# Patient Record
Sex: Male | Born: 1965 | Hispanic: Yes | Marital: Single | Smoking: Never smoker
Health system: Southern US, Community
[De-identification: ages and names within clinical notes are randomized; demographics above are authoritative.]

## PROBLEM LIST (undated history)

## (undated) HISTORY — PX: CHOLECYSTECTOMY: SHX55

---

## 2021-06-21 ENCOUNTER — Emergency Department: Payer: Self-pay

## 2021-06-21 ENCOUNTER — Emergency Department
Admission: EM | Admit: 2021-06-21 | Discharge: 2021-06-21 | Disposition: A | Discharge status: Home / Self Care | Payer: Self-pay | Attending: Emergency Medicine | Admitting: Emergency Medicine

## 2021-06-21 DIAGNOSIS — R1011 Right upper quadrant pain: Secondary | ICD-10-CM | POA: Insufficient documentation

## 2021-06-21 DIAGNOSIS — R1012 Left upper quadrant pain: Secondary | ICD-10-CM | POA: Insufficient documentation

## 2021-06-21 DIAGNOSIS — R1010 Upper abdominal pain, unspecified: Secondary | ICD-10-CM

## 2021-06-21 LAB — COMPREHENSIVE METABOLIC PANEL
ALT: 51 U/L — ABNORMAL HIGH (ref 0–44)
AST: 68 U/L — ABNORMAL HIGH (ref 15–41)
Albumin: 3.9 g/dL (ref 3.5–5.0)
Alkaline Phosphatase: 116 U/L (ref 38–126)
Anion gap: 5 (ref 5–15)
BUN: 16 mg/dL (ref 6–20)
CO2: 25 mmol/L (ref 22–32)
Calcium: 9 mg/dL (ref 8.9–10.3)
Chloride: 109 mmol/L (ref 98–111)
Creatinine, Ser: 0.63 mg/dL (ref 0.61–1.24)
GFR, Estimated: >60 mL/min (ref >60)
Glucose, Bld: 131 mg/dL — ABNORMAL HIGH (ref 70–99)
Potassium: 3.7 mmol/L (ref 3.5–5.1)
Sodium: 139 mmol/L (ref 135–145)
Total Bilirubin: 0.6 mg/dL (ref 0.3–1.2)
Total Protein: 8.1 g/dL (ref 6.5–8.1)

## 2021-06-21 LAB — LIPASE, BLOOD: Lipase: 31 U/L (ref 11–51)

## 2021-06-21 LAB — TROPONIN I (HIGH SENSITIVITY)
Troponin I (High Sensitivity): 4 ng/L (ref <18)
Troponin I (High Sensitivity): 3 ng/L (ref <18)

## 2021-06-21 LAB — CBC
HCT: 44.9 % (ref 39.0–52.0)
Hemoglobin: 15.2 g/dL (ref 13.0–17.0)
MCH: 30.9 pg (ref 26.0–34.0)
MCHC: 33.9 g/dL (ref 30.0–36.0)
MCV: 91.3 fL (ref 80.0–100.0)
Platelets: 302 10*3/uL (ref 150–400)
RBC: 4.92 MIL/uL (ref 4.22–5.81)
RDW: 12 % (ref 11.5–15.5)
WBC: 8.2 10*3/uL (ref 4.0–10.5)
nRBC: 0 % (ref 0.0–0.2)

## 2021-06-21 MED ORDER — PANTOPRAZOLE SODIUM 40 MG IV SOLR
40.0000 mg | Freq: Once | INTRAVENOUS | Status: AC
  Administered 2021-06-21: 40 mg via INTRAVENOUS
  Filled 2021-06-21: qty 10

## 2021-06-21 MED ORDER — METOCLOPRAMIDE HCL 10 MG PO TABS
10.0000 mg | ORAL_TABLET | Freq: Four times a day (QID) | ORAL | 0 refills | Status: AC | PRN
Start: 2021-06-21 — End: ?

## 2021-06-21 MED ORDER — ALUMINUM-MAGNESIUM-SIMETHICONE 200-200-20 MG/5ML PO SUSP: 30.0000 mL | Freq: Three times a day (TID) | ORAL | 0 refills | Status: AC

## 2021-06-21 MED ORDER — HYDROMORPHONE HCL 1 MG/ML IJ SOLN
1.0000 mg | Freq: Once | INTRAMUSCULAR | Status: AC
  Administered 2021-06-21: 1 mg via INTRAVENOUS
  Filled 2021-06-21: qty 1

## 2021-06-21 MED ORDER — IOHEXOL 350 MG/ML SOLN
100.0000 mL | Freq: Once | INTRAVENOUS | Status: AC | PRN
  Administered 2021-06-21: 100 mL via INTRAVENOUS

## 2021-06-21 MED ORDER — ONDANSETRON HCL 4 MG/2ML IJ SOLN
4.0000 mg | Freq: Once | INTRAMUSCULAR | Status: AC
  Administered 2021-06-21: 4 mg via INTRAVENOUS
  Filled 2021-06-21: qty 2

## 2021-06-21 MED ORDER — FAMOTIDINE 20 MG PO TABS: 20.0000 mg | ORAL_TABLET | Freq: Two times a day (BID) | ORAL | 0 refills | Status: AC

## 2021-06-21 MED ORDER — SUCRALFATE 1 G PO TABS
1.0000 g | ORAL_TABLET | Freq: Once | ORAL | Status: AC
  Administered 2021-06-21: 1 g via ORAL
  Filled 2021-06-21: qty 1

## 2021-06-21 NOTE — ED Notes (Signed)
Pt A&O, IV's removed, pt given discharge instructions, pt ambulating with steady gait. 

## 2021-06-21 NOTE — Discharge Instructions (Addendum)
Your lab tests and CT scan today were all okay.  Please take medications to soothe your stomach as prescribed and follow up with primary care or gastroenterology as needed.

## 2021-06-21 NOTE — ED Triage Notes (Signed)
Pt comes pov with sudden onset abd pain and swelling with nausea. Belly appears very distended and firm. Moaning in severe pain. Spanish speaking only.

## 2021-06-21 NOTE — ED Provider Notes (Signed)
[facility] Provider Note    Event Date/Time   First MD Initiated Contact with Patient 06/21/21 1453     (approximate)   History   Chief Complaint: Abdominal Pain   HPI  Spanish video interpreter used throughout encounter  Michael Pena is a 56 y.o. male with no known past medical history who reports being in his usual state of health until he had sudden onset of epigastric abdominal pain that started at about 1:00 PM today.  Severe, nonradiating, no aggravating or alleviating factors.  He feels that his abdomen is becoming increasingly distended and firm over this time as well.  Denies ever having symptoms like this before.  No chest pain or shortness of breath.  No constipation or diarrhea or vomiting.     Physical Exam   Triage Vital Signs: ED Triage Vitals  Enc Vitals Group     BP 06/21/21 1447 (!) 133/97     Pulse Rate 06/21/21 1447 90     Resp 06/21/21 1447 (!) 22     Temp 06/21/21 1447 98.6 F (37 C)     Temp Source 06/21/21 1447 Oral     SpO2 06/21/21 1447 100 %     Weight 06/21/21 1447 220 lb (99.8 kg)     Height --      Head Circumference --      Peak Flow --      Pain Score 06/21/21 1446 10     Pain Loc --      Pain Edu? --      Excl. in [place]? --     Most recent vital signs: Vitals:   06/21/21 1447 06/21/21 1615  BP: (!) 133/97 111/81  Pulse: 90 74  Resp: (!) 22 14  Temp: 98.6 F (37 C)   SpO2: 100% 93%    General: Awake, no distress.  CV:  Good peripheral perfusion.  Regular rate and rhythm Resp:  Normal effort.  Clear to auscultation bilaterally Abd:  Moderately distended.  Abdomen is soft.  He has left upper quadrant and epigastric tenderness.  Not tympanitic.  Bedside point-of-care ultrasound FAST exam negative for free fluid in the gutters.  Unable to obtain good images of gallbladder or aorta due to air artifact Other:  No lower extremity edema.   ED Results / Procedures / Treatments   Labs (all labs  ordered are listed, but only abnormal results are displayed) Labs Reviewed  COMPREHENSIVE METABOLIC PANEL - Abnormal; Notable for the following components:      Result Value   Glucose, Bld 131 (*)    AST 68 (*)    ALT 51 (*)    All other components within normal limits  CBC  LIPASE, BLOOD  TROPONIN I (HIGH SENSITIVITY)  TROPONIN I (HIGH SENSITIVITY)     EKG Interpreted by me Normal sinus rhythm rate of 89.  Normal axis and intervals.  Normal QRS ST segments and T waves.   RADIOLOGY CT chest abdomen pelvis viewed and interpreted by me, no dissection or aortic aneurysm.  No free air.  Radiology report reviewed, no acute findings   PROCEDURES:  Procedures   MEDICATIONS ORDERED IN ED: Medications  HYDROmorphone (DILAUDID) injection 1 mg (1 mg Intravenous Given 06/21/21 1556)  ondansetron (ZOFRAN) injection 4 mg (4 mg Intravenous Given 06/21/21 1556)  pantoprazole (PROTONIX) injection 40 mg (40 mg Intravenous Given 06/21/21 1556)  iohexol (OMNIPAQUE) 350 MG/ML injection 100 mL (100 mLs Intravenous Contrast Given 06/21/21 1534)  sucralfate (  CARAFATE) tablet 1 g (1 g Oral Given 06/21/21 1610)     IMPRESSION / MDM / ASSESSMENT AND PLAN / ED COURSE  I reviewed the triage vital signs and the nursing notes.                              Differential diagnosis includes, but is not limited to, AAA, aortic dissection, GI perforation, bowel obstruction, pancreatitis, gastritis, cholecystitis, choledocholithiasis  Patient's presentation is most consistent with acute presentation with potential threat to life or bodily function.  Patient presents with sudden onset of severe pain in the upper abdomen.  We will need to obtain CT abdomen pelvis immediately to further evaluate while waiting for labs.  Will give IV fluids, Dilaudid IV, Zofran for symptom relief.   ----------------------------------------- 4:11 PM on 06/21/2021 ----------------------------------------- CT negative, labs  negative.  Will trend troponin, obtain ultrasound right upper quadrant, give Carafate.  ----------------------------------------- 5:43 PM on 06/21/2021 ----------------------------------------- [place] negative. Pain resolved. Awaiting second trop, after which pt can be discharged if reassuring. Suspect gastritis. Rec f/u pcp/ GI. Return precautions discussed.     FINAL CLINICAL IMPRESSION(S) / ED DIAGNOSES   Final diagnoses:  Pain of upper abdomen     Rx / DC Orders   ED Discharge Orders          Ordered    aluminum-magnesium hydroxide-simethicone (MAALOX) [id number] MG/5ML SUSP  3 times daily before meals & bedtime        06/21/21 1741    famotidine (PEPCID) 20 MG tablet  2 times daily        06/21/21 1741    metoCLOPramide (REGLAN) 10 MG tablet  Every 6 hours PRN        06/21/21 1741             Note:  This document was prepared using Dragon voice recognition software and may include unintentional dictation errors.   Carrie Mew, MD 06/21/21 1743

## 2022-07-20 ENCOUNTER — Other Ambulatory Visit: Payer: Self-pay

## 2022-07-20 ENCOUNTER — Emergency Department: Payer: Self-pay

## 2022-07-20 ENCOUNTER — Encounter: Payer: Self-pay | Admitting: *Deleted

## 2022-07-20 ENCOUNTER — Emergency Department
Admission: EM | Admit: 2022-07-20 | Discharge: 2022-07-20 | Disposition: A | Discharge status: Home / Self Care | Payer: Self-pay | Attending: Emergency Medicine | Admitting: Emergency Medicine

## 2022-07-20 DIAGNOSIS — Z23 Encounter for immunization: Secondary | ICD-10-CM | POA: Insufficient documentation

## 2022-07-20 DIAGNOSIS — L989 Disorder of the skin and subcutaneous tissue, unspecified: Secondary | ICD-10-CM | POA: Insufficient documentation

## 2022-07-20 MED ORDER — TETANUS-DIPHTH-ACELL PERTUSSIS 5-2.5-18.5 LF-MCG/0.5 IM SUSY
0.5000 mL | PREFILLED_SYRINGE | Freq: Once | INTRAMUSCULAR | Status: AC
  Administered 2022-07-20: 0.5 mL via INTRAMUSCULAR
  Filled 2022-07-20: qty 0.5

## 2022-07-20 MED ORDER — MELOXICAM 15 MG PO TABS: 15.0000 mg | ORAL_TABLET | Freq: Every day | ORAL | 0 refills | Status: AC

## 2022-07-20 NOTE — ED Triage Notes (Signed)
Triage done using pacific interpreter spanish.   Pt is here due to wart on bottom of foot.

## 2022-07-20 NOTE — ED Provider Notes (Signed)
[facility] Provider Note  Patient Contact: 10:41 PM (approximate)   History   Foot Pain   HPI  Michael Pena is a 57 y.o. male who presents the emergency department complaining of foot pain.  Patient stated that he stepped on a nail roughly 3 months ago.  Since then he has had a hard area form around where the puncture wound was and it is now starting to cause pain with ambulation.  No erythema, warmth to palpation.  No drainage from the site.  No fevers or chills.  No other complaints at this time.  Unsure last tetanus shot.     Physical Exam   Triage Vital Signs: ED Triage Vitals  Enc Vitals Group     BP 07/20/22 2004 (!) 132/93     Pulse Rate 07/20/22 2004 75     Resp 07/20/22 2004 16     Temp 07/20/22 2004 98.6 F (37 C)     Temp Source 07/20/22 2004 Oral     SpO2 07/20/22 2004 100 %     Weight 07/20/22 2005 220 lb (99.8 kg)     Height --      Head Circumference --      Peak Flow --      Pain Score 07/20/22 2005 6     Pain Loc --      Pain Edu? --      Excl. in [place]? --     Most recent vital signs: Vitals:   07/20/22 2004 07/20/22 2247  BP: (!) 132/93 (!) 128/91  Pulse: 75 74  Resp: 16 18  Temp: 98.6 F (37 C) 98.2 F (36.8 C)  SpO2: 100% 100%     General: Alert and in no acute distress.  Cardiovascular:  Good peripheral perfusion Respiratory: Normal respiratory effort without tachypnea or retractions. Lungs CTAB.  Musculoskeletal: Full range of motion to all extremities.  Visualization of the sole of the left leg concerning for either scar tissue or developing wart to the sole of the foot.  There is tenderness to palpation but no warmth, fluctuance or induration.  No cellulitic changes.  Bedside ultrasound obtained with no evidence of retained foreign body, it appears that patient has a thick structure again consistent with physical exam but no evidence of fluid collection requiring incision and drainage. Neurologic:  No  gross focal neurologic deficits are appreciated.  Skin:   No rash noted Other:   ED Results / Procedures / Treatments   Labs (all labs ordered are listed, but only abnormal results are displayed) Labs Reviewed - No data to display   EKG     RADIOLOGY    No results found.  PROCEDURES:  Critical Care performed: No  Ultrasound ED Soft Tissue  Date/Time: 07/20/2022 11:45 PM  Performed by: Racheal Patches, PA-C Authorized by: Racheal Patches, PA-C   Procedure details:    Indications: localization of abscess, evaluate for cellulitis and evaluate for foreign body     Transverse view:  Visualized   Longitudinal view:  Visualized   Images: not archived   Location:    Location: lower extremity     Location comment:  L foot   Side:  Left Findings:     no abscess present    no cellulitis present    no foreign body present Comments:     Thick tissue consistent with either developing wart or scar tissue is identified but no fluid collection, no retained foreign body, no  stranding consistent with cellulitis.    MEDICATIONS ORDERED IN ED: Medications  Tdap (BOOSTRIX) injection 0.5 mL (0.5 mLs Intramuscular Given 07/20/22 2221)     IMPRESSION / MDM / ASSESSMENT AND PLAN / ED COURSE  I reviewed the triage vital signs and the nursing notes.                                 Differential diagnosis includes, but is not limited to, callus, wart, retained foreign body, cellulitis, abscess   Patient's presentation is most consistent with acute presentation with potential threat to life or bodily function.   Patient's diagnosis is consistent with skin lesion of the foot.  Patient presents emergency department developing lesion to the sole of the foot.  Patient stepped on a nail roughly 3 months ago, developed symptoms over the last 2 to 3 weeks.  Patient has what appears to be either a developing wart or callus formation to the sole of the foot.  Bedside ultrasound  revealed no evidence retained foreign body, cellulitis, abscess.  At this time recommended moleskin, anti-inflammatory and follow-up with podiatry.  Patient agreeable with this plan.  Return precautions discussed with the patient..  Patient is given ED precautions to return to the ED for any worsening or new symptoms.     FINAL CLINICAL IMPRESSION(S) / ED DIAGNOSES   Final diagnoses:  Lesion of skin of foot     Rx / DC Orders   ED Discharge Orders          Ordered    meloxicam (MOBIC) 15 MG tablet  Daily        07/20/22 2241             Note:  This document was prepared using Dragon voice recognition software and may include unintentional dictation errors.   Lanette Hampshire 07/20/22 2347    Pilar Jarvis, MD 07/21/22 [phone]

## 2022-07-20 NOTE — Discharge Instructions (Addendum)
Use moleskin to help with symptoms. Cut a hole into the material, stick to the bottom of your foot.  Follow-up with the foot doctor.

## 2023-07-03 IMAGING — CT CT ANGIO CHEST-ABD-PELV FOR DISSECTION W/ AND WO/W CM
2 of 7 series · 14 of 46 positions shown, 16 images · non-contrast
Comparison: None Available.

CLINICAL DATA: Sudden onset of abdominal pain, swelling and nausea.
Abdomen distended and firm. Patient moaning in pain.

EXAM:
CT ANGIOGRAPHY CHEST, ABDOMEN AND PELVIS
TECHNIQUE: Non-contrast CT of the chest was initially obtained.

[Series 5: arterial · axial · arterial · 0.79mm/px · z∈[+1032,+1582]mm · 11 of 311 slices shown, 13 images]
[im 18/311  soft-tissue]
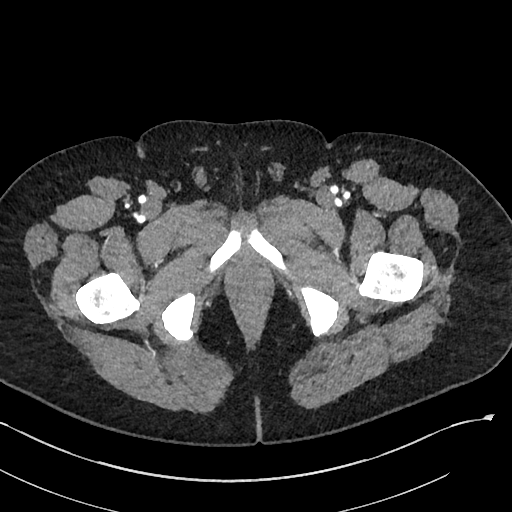
[im 18/311  bone]
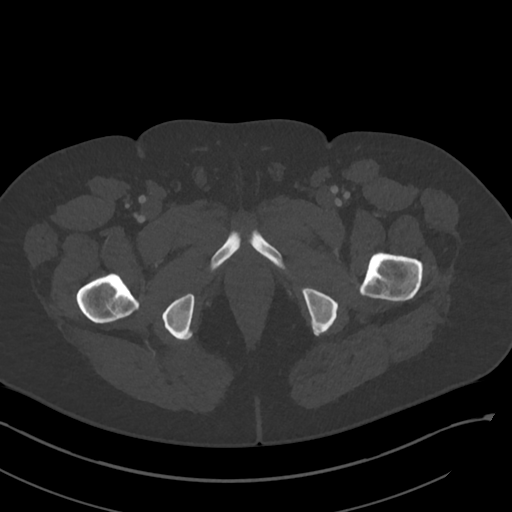
[im 52/311  soft-tissue]
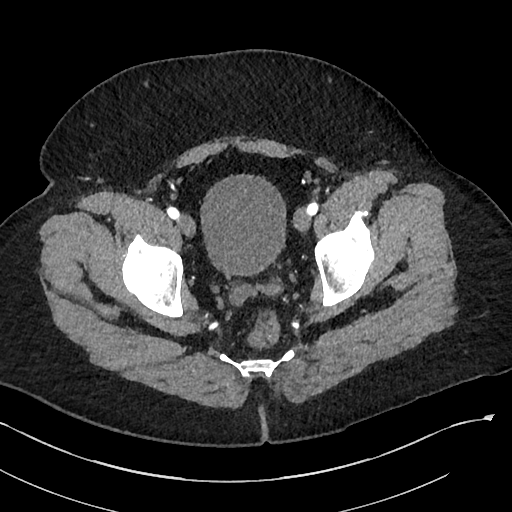
[im 69/311  soft-tissue]
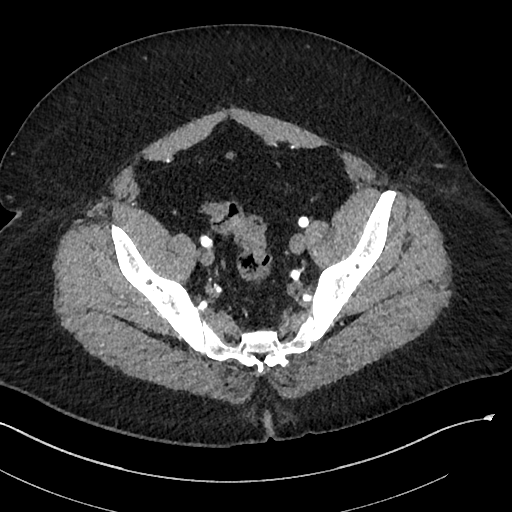
[im 104/311  soft-tissue]
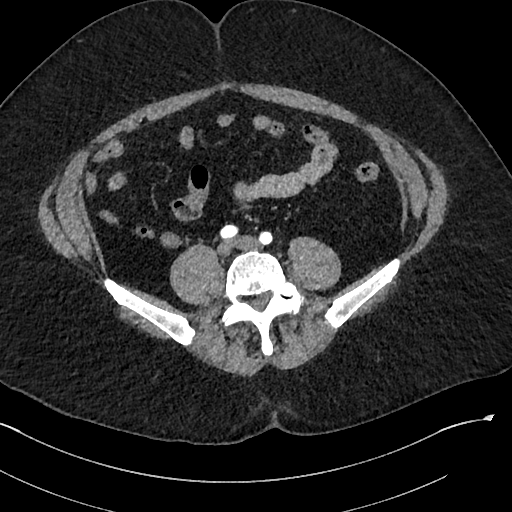
[im 121/311  soft-tissue]
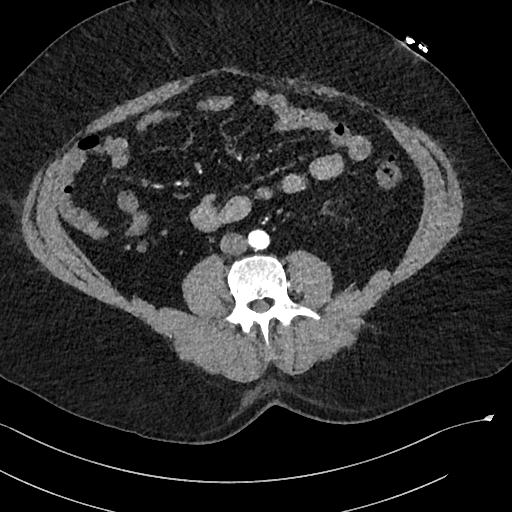
[im 156/311  soft-tissue]
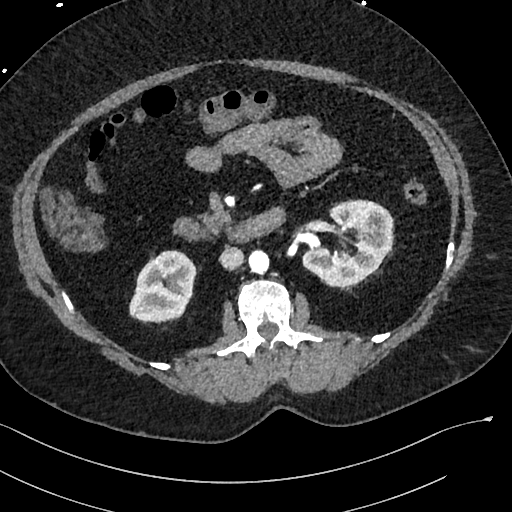
[im 190/311  soft-tissue]
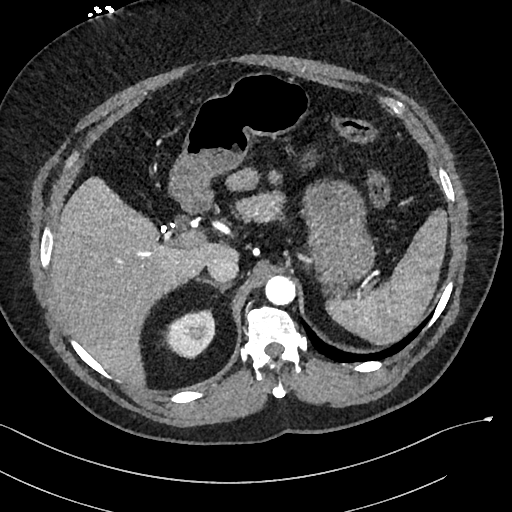
[im 207/311  soft-tissue]
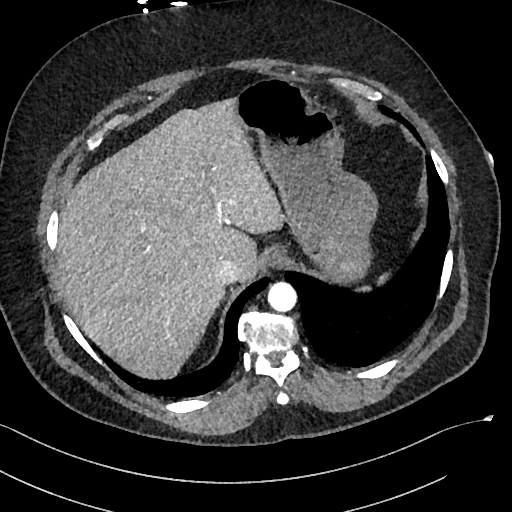
[im 242/311  soft-tissue]
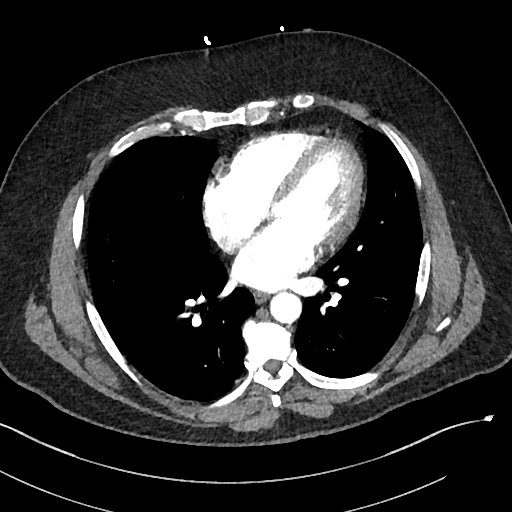
[im 242/311  bone]
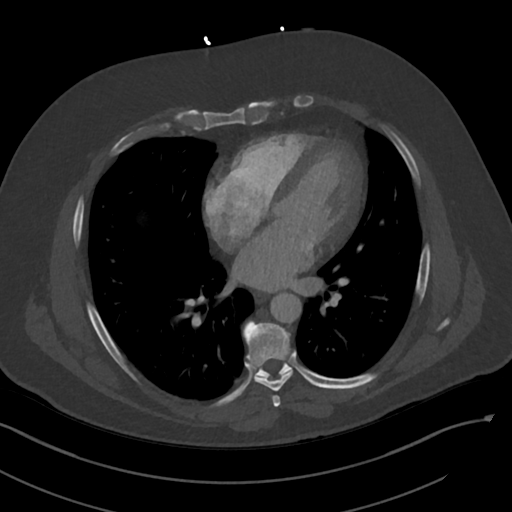
[im 259/311  soft-tissue]
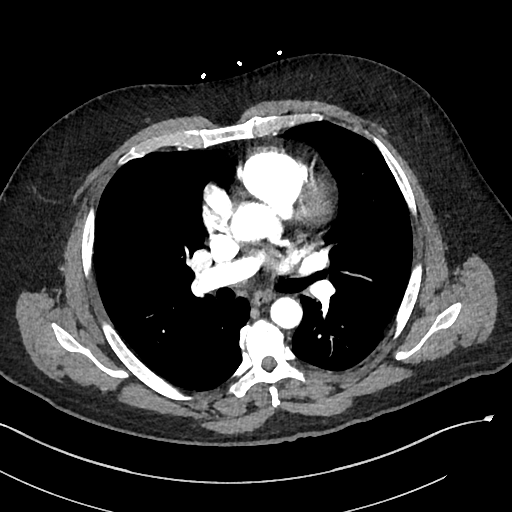
[im 293/311  soft-tissue]
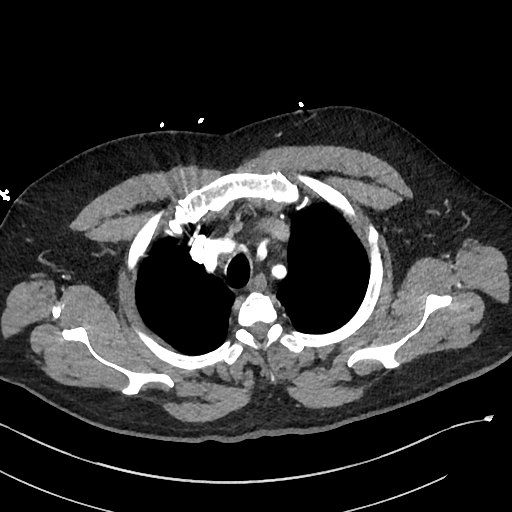

[Series 8: cor · coronal · 0.87mm/px · 3 of 163 slices shown]
[im 41/163  soft-tissue]
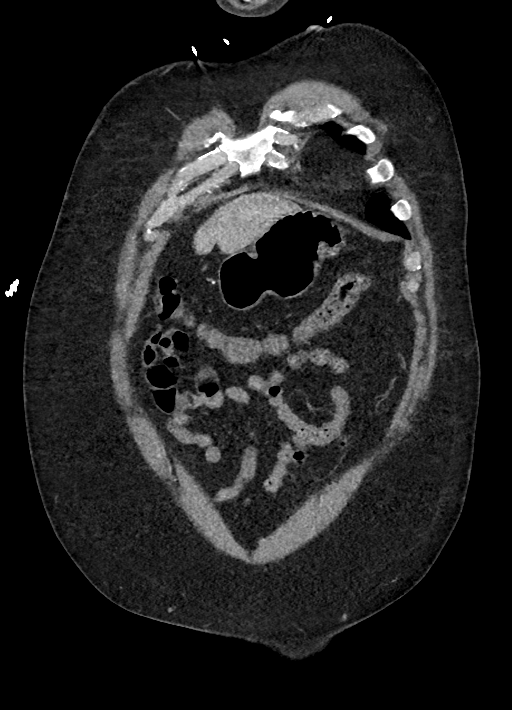
[im 82/163  soft-tissue]
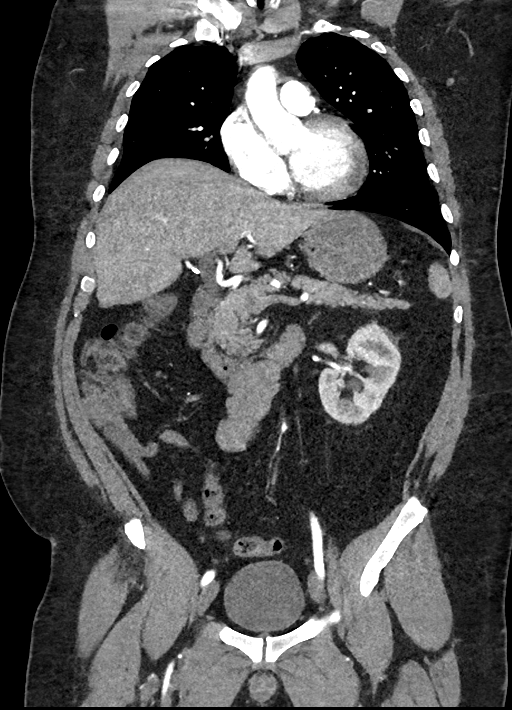
[im 122/163  soft-tissue]
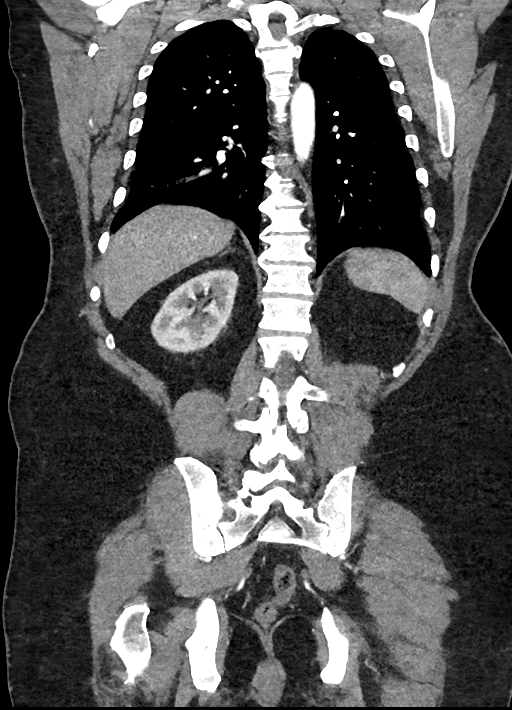

[14 of 46 positions shown; findings below may reference images not displayed]

Multidetector CT imaging through the chest, abdomen and pelvis was
performed using the standard protocol during bolus administration of
intravenous contrast. Multiplanar reconstructed images and MIPs were
obtained and reviewed to evaluate the vascular anatomy.

RADIATION DOSE REDUCTION: This exam was performed according to the
departmental dose-optimization program which includes automated
exposure control, adjustment of the mA and/or kV according to
patient size and/or use of iterative reconstruction technique.

CONTRAST:  100mL OMNIPAQUE IOHEXOL 350 MG/ML SOLN
FINDINGS: CTA CHEST FINDINGS

Cardiovascular: Heart normal in size and configuration. No
pericardial effusion. Normal thoracic aorta. No dissection or
atherosclerosis. Arch branch vessels are widely patent. Pulmonary
arteries are well opacified. No evidence of a pulmonary embolism.

Mediastinum/Nodes: No enlarged mediastinal, hilar, or axillary lymph
nodes. Thyroid gland, trachea, and esophagus demonstrate no
significant findings.

Lungs/Pleura: Lungs are clear. No pleural effusion or pneumothorax.

Musculoskeletal: No fracture or acute finding. No bone lesion. Mild
thoracic spine disc degenerative changes. No chest wall mass.

Review of the MIP images confirms the above findings.

CTA ABDOMEN AND PELVIS FINDINGS

VASCULAR

Aorta: Normal.  No dissection or aneurysm.  No atherosclerosis.

Celiac: Patent without evidence of aneurysm, dissection, vasculitis
or significant stenosis.

SMA: Patent without evidence of aneurysm, dissection, vasculitis or
significant stenosis.

Renals: Both renal arteries are patent without evidence of aneurysm,
dissection, vasculitis, fibromuscular dysplasia or significant
stenosis.

IMA: Patent without evidence of aneurysm, dissection, vasculitis or
significant stenosis.

Inflow: Patent without evidence of aneurysm, dissection, vasculitis
or significant stenosis.

Veins: No obvious venous abnormality within the limitations of this
arterial phase study.

Review of the MIP images confirms the above findings.

NON-VASCULAR

Hepatobiliary: No focal liver abnormality is seen. Status post
cholecystectomy. No biliary dilatation.

Pancreas: Unremarkable. No pancreatic ductal dilatation or
surrounding inflammatory changes.

Spleen: Normal in size without focal abnormality.

Adrenals/Urinary Tract: Adrenal glands are unremarkable. Kidneys are
normal, without renal calculi, focal lesion, or hydronephrosis.
Bladder is unremarkable.

Stomach/Bowel: Stomach is within normal limits. Appendix appears
normal. No evidence of bowel wall thickening, distention, or
inflammatory changes.

Lymphatic: No enlarged lymph nodes.

Reproductive: Unremarkable.

Other: No abdominal wall hernia or abnormality. No abdominopelvic
ascites.

Musculoskeletal: No fracture or acute finding.  No bone lesion.

Review of the MIP images confirms the above findings.
IMPRESSION: CTA

1. Normal thoracoabdominal aorta. No dissection or atherosclerosis.
Branch vessels are widely patent.

NON CTA

1. No acute findings within the chest, abdomen or pelvis. No
findings to account for the patient's abdominal pain or swelling.
